# Patient Record
Sex: Male | Born: 1995 | Race: Black or African American | Hispanic: No | Marital: Single | Smoking: Never smoker
Health system: Southern US, Community
[De-identification: ages and names within clinical notes are randomized; demographics above are authoritative.]

## PROBLEM LIST (undated history)

## (undated) HISTORY — PX: WISDOM TOOTH EXTRACTION: SHX21

---
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
## 2014-01-03 ENCOUNTER — Ambulatory Visit (INDEPENDENT_AMBULATORY_CARE_PROVIDER_SITE_OTHER): Payer: BC Managed Care – PPO | Admitting: Family Medicine

## 2014-01-03 VITALS — BP 114/78 | HR 62 | Temp 98.2°F | Resp 16 | Ht 73.5 in | Wt 187.0 lb

## 2014-01-03 DIAGNOSIS — W458XXA Other foreign body or object entering through skin, initial encounter: Secondary | ICD-10-CM

## 2014-01-03 DIAGNOSIS — T148XXA Other injury of unspecified body region, initial encounter: Secondary | ICD-10-CM

## 2014-01-03 MED ORDER — CEPHALEXIN 500 MG PO CAPS: 500.0000 mg | ORAL_CAPSULE | Freq: Three times a day (TID) | ORAL | Status: DC

## 2014-01-03 NOTE — Progress Notes (Signed)
18 year old student going off to [employer] in a couple days. He stepped on a splinter. The right great toe was infected and mother attempted to remove the entire splinter. Patient continued to have some discomfort on the fibular aspect of the great toe.  Objective: Patient has minimal tenderness with a puncture site on the fibular side of the left great toe. After sterile prep with isopropyl alcohol, the area was treated in no definite splinter was seen. Instead there was a yellow discoloration and general area.  Assessment: Residue from splinter  Splinter in skin - Plan: cephALEXin (KEFLEX) 500 MG capsule  Signed, Elvina SidleKurt Lurine Imel, MD

## 2014-05-05 ENCOUNTER — Encounter: Payer: Self-pay | Admitting: Sports Medicine

## 2014-05-05 ENCOUNTER — Ambulatory Visit (INDEPENDENT_AMBULATORY_CARE_PROVIDER_SITE_OTHER): Payer: BC Managed Care – PPO | Admitting: Sports Medicine

## 2014-05-05 VITALS — BP 114/75 | Ht 75.0 in | Wt 183.0 lb

## 2014-05-05 DIAGNOSIS — M79671 Pain in right foot: Secondary | ICD-10-CM | POA: Insufficient documentation

## 2014-05-05 DIAGNOSIS — Q742 Other congenital malformations of lower limb(s), including pelvic girdle: Principal | ICD-10-CM

## 2014-05-05 DIAGNOSIS — Q6689 Other  specified congenital deformities of feet: Secondary | ICD-10-CM | POA: Diagnosis not present

## 2014-05-05 NOTE — Progress Notes (Signed)
  Frank Patrick - 18 y.o. male MRN [id number]  Date of birth: 21-Feb-1996  SUBJECTIVE:  Including CC & ROS.  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 He presents the office today for evaluation of medial right foot pain. Patient reports that on Monday night 05/02/14 he started experiencing soreness in his right foot medial arch. He played throughout the game ignoring the pain is much possible. Following the game he had significant soreness which he iced. The next day he was having difficulty walking because of pain but was weightbearing. Over the last 2 days he reports that the pain has improved although it is still present. He continues to ice intermittently but is not taking any medications.   ROS: Review of systems otherwise negative except for information present in HPI  HISTORY: Past Medical, Surgical, Social, and Family History Reviewed & Updated per EMR. Pertinent Historical Findings include: Nonsmoker Nondiabetic  DATA REVIEWED: No other data available  PHYSICAL EXAM:  VS: BP:114/75 mmHg  HR: bpm  TEMP: ( )  RESP:   HT:6\' 3"  (190.5 cm)   WT:183 lb (83.008 kg)  BMI:22.9 FOOT EXAM:  General: well nourished Skin of LE: warm; dry, no rashes, lesions, ecchymosis or erythema. Vascular: Dorsal pedal pulses 2+ bilaterally Neurologically: Sensation to light touch lower extremities equal and intact bilaterally.  Observation - no ecchymosis, no edema, or hematoma present  Palpation: TTP over the medial navicular bone particularly where the spring ligament attaches. No anterior navicular bone tenderness.Normal ankle motion and strength bilaterally  Extension/flexion 5/5 strength bilaterally in toes Weight-bearing foot exam:  First ray: Neutral  Second ray: Slightly longer than the first toe Lateral ray: Collapse Longitudinal arch: Significant medial arch collapse with standing  Heel position: Valgus contributing to arch collapse Gait analysis:    Striking location: Mid foot Foot motion: Pronation Knee motion: Neutral Hip motion: Neutral  MSK US: Pressure skeletal ultrasound examination of the navicular bone reveals no signs of hypoechoic changes on the medial and anterior aspect unable to fully evaluate the inferior and posterior aspect  ASSESSMENT & PLAN: See problem based charting & AVS for pt instructions.  We spent greater than 50% of our 30 minute office visit in counseling education regarding the patient current clinical problem, risks and benefits of treatment options, and recommend plans for treatment or further evaluation

## 2014-05-05 NOTE — Assessment & Plan Note (Signed)
Patient presented with acute onset of navicular bone pain of the right foot which has gradually improves throughout the week. Based on his clinical history and examination patient has prominent collapsing of medial longitudinal arch as well as valgus stress at the heel, and pronation of gait all these factors can contribute to a differential of spraining of the spring ligament, navicular bone bruising, or potentially navicular stress fracture.   Recommendations: Since the patient is clinically improving we will start with adjustments into his insoles with arch support. Encouraged continued icing and no aggressive running, cutting, pivoting the next 5 days Will follow-up with the patient on Monday by phone see if he continues to clinically improve. If not we'll have the patient be seen by Dr. Thurston HoleWainer on Tuesday to obtain x-rays and possibly an MRI to rule out navicular stress fracture

## 2014-05-26 ENCOUNTER — Encounter: Payer: Self-pay | Admitting: Sports Medicine

## 2014-05-26 ENCOUNTER — Ambulatory Visit (INDEPENDENT_AMBULATORY_CARE_PROVIDER_SITE_OTHER): Payer: BLUE CROSS/BLUE SHIELD | Admitting: Sports Medicine

## 2014-05-26 VITALS — BP 123/74 | Ht 75.0 in | Wt 183.0 lb

## 2014-05-26 DIAGNOSIS — R269 Unspecified abnormalities of gait and mobility: Secondary | ICD-10-CM | POA: Diagnosis not present

## 2014-05-26 DIAGNOSIS — Q742 Other congenital malformations of lower limb(s), including pelvic girdle: Principal | ICD-10-CM

## 2014-05-26 DIAGNOSIS — M79671 Pain in right foot: Secondary | ICD-10-CM

## 2014-05-26 DIAGNOSIS — Q6689 Other  specified congenital deformities of feet: Secondary | ICD-10-CM | POA: Diagnosis not present

## 2014-05-26 NOTE — Progress Notes (Signed)
  Frank Patrick - 19 y.o. male MRN [id number]  Date of birth: April 27, 1996  SUBJECTIVE:  Including CC & ROS.  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 Patient was seen back on 05/05/14 for pain in the medial arch and over the navicular is treated with 1 week of rest and scaphoid pad with insoles. After this time. In returning to basketball he developed recurrence of his pain. He was subsequently evaluated with an MRI of the foot the revealed no evidence of stress fracture within the navicular bone, mild increased signal in the cuboid, and some swelling in the sinus tarsi. This week patient reports the most of his pain has improved but not completely resolved. He feels that the scaphoid pad that was added to his insoles did provide him some relief. He also completed 10 days of meloxicam.   ROS: Review of systems otherwise negative except for information present in HPI  HISTORY: Past Medical, Surgical, Social, and Family History Reviewed & Updated per EMR. Pertinent Historical Findings include: Nonsmoker Nondiabetic  DATA REVIEWED: Personally reviewed patient's right foot MRI revealing mild increased signaling in the cuboid no evidence of increasingly in the navicular, mild edema in the sinus tarsi  PHYSICAL EXAM:  VS: BP:123/74 mmHg  HR: bpm  TEMP: ( )  RESP:   HT:6\' 3"  (190.5 cm)   WT:183 lb (83.008 kg)  BMI:22.9 FOOT EXAM:  General: well nourished Skin of LE: warm; dry, no rashes, lesions, ecchymosis or erythema. Vascular: Dorsal pedal pulses 2+ bilaterally Neurologically: Sensation to light touch lower extremities equal and intact bilaterally.  Observation - no ecchymosis, no edema, or hematoma present  Palpation: TTP over the medial navicular bone particularly where the spring ligament attaches. No anterior navicular bone tenderness.Normal ankle motion and  strength bilaterally  Extension/flexion 5/5 strength bilaterally in toes Weight-bearing foot exam:  First ray: Neutral Second ray: Slightly longer than the first toe Lateral ray: Collapse Longitudinal arch: Significant medial arch collapse with standing - pes plantus Heel position: Valgus contributing to arch collapse Gait analysis:  Striking location: Mid foot Foot motion: significant pronation Knee motion: Neutral Hip motion: Neutral  ASSESSMENT & PLAN: See problem based charting & AVS for pt instructions. Patient presented today for follow-up right midfoot navicular pain which was likely an acute resolving bone limitation. He also has evidence of gait abnormalities with significant pronation and valgus heels. Presents today for [employer] orthotics  Orthotic Fitting and Adjustment note: Patient was fitted for a : standard, cushioned, semi-rigid orthotic.  The orthotic was heated and afterward the patient stood on the orthotic blank positioned on the orthotic stand.  The patient was positioned in subtalar neutral position and 10 degrees of ankle dorsiflexion in a weight bearing stance.  After completion of molding, a stable base was applied to the orthotic blank.  The blank was ground to a stable position for weight bearing.  Size: 12 Base: Blue EVA Posting: none Additional orthotic padding: none  Greater than 50% of the patient's visit for a total of 30 minutes, was spent conducting face-to-face counseling for bilateral foot orthotics fitting and constructing

## 2014-07-19 IMAGING — CR DG ANKLE COMPLETE 3+V*R*
1 series · 1 of 1 positions shown · non-contrast
Comparison: Right foot obtained at the same time.

CLINICAL DATA: Right ankle pain.  No reported injury.

RIGHT ANKLE - COMPLETE 3+ VIEW

[AP]
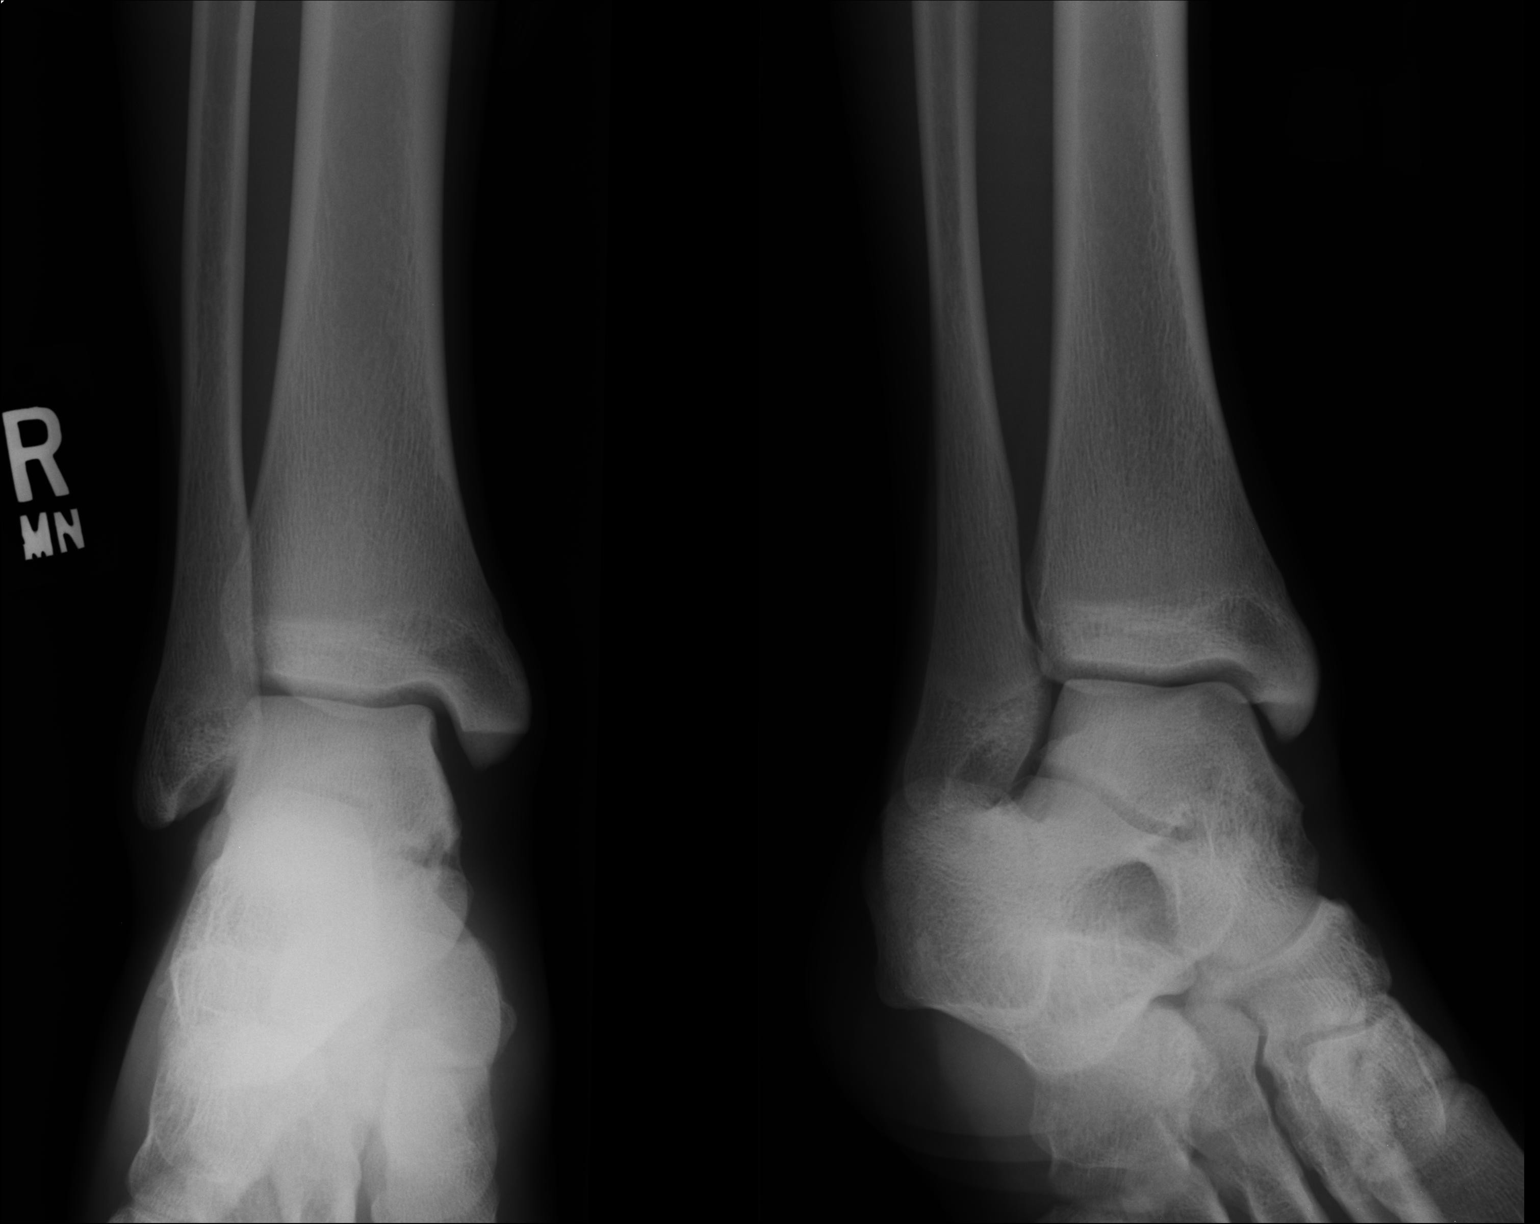

[1 of 1 positions shown; findings below may reference images not displayed]

FINDINGS: AP and oblique views of the right ankle are submitted for
interpretation.  The lateral view of the foot includes the ankle.
Normal appearing bones and soft tissues.
IMPRESSION: Normal examination.

## 2018-11-30 DIAGNOSIS — Z23 Encounter for immunization: Secondary | ICD-10-CM | POA: Diagnosis not present

## 2018-12-18 DIAGNOSIS — Z0000 Encounter for general adult medical examination without abnormal findings: Secondary | ICD-10-CM | POA: Diagnosis not present

## 2018-12-18 DIAGNOSIS — Z13220 Encounter for screening for lipoid disorders: Secondary | ICD-10-CM | POA: Diagnosis not present

## 2019-01-04 DIAGNOSIS — R17 Unspecified jaundice: Secondary | ICD-10-CM | POA: Diagnosis not present

## 2019-11-01 DIAGNOSIS — S93402A Sprain of unspecified ligament of left ankle, initial encounter: Secondary | ICD-10-CM | POA: Diagnosis not present

## 2019-11-01 DIAGNOSIS — M25572 Pain in left ankle and joints of left foot: Secondary | ICD-10-CM | POA: Diagnosis not present

## 2019-11-23 DIAGNOSIS — S93402A Sprain of unspecified ligament of left ankle, initial encounter: Secondary | ICD-10-CM | POA: Diagnosis not present

## 2019-12-20 DIAGNOSIS — Z0000 Encounter for general adult medical examination without abnormal findings: Secondary | ICD-10-CM | POA: Diagnosis not present

## 2019-12-20 DIAGNOSIS — Z13220 Encounter for screening for lipoid disorders: Secondary | ICD-10-CM | POA: Diagnosis not present

## 2020-03-03 DIAGNOSIS — M7652 Patellar tendinitis, left knee: Secondary | ICD-10-CM | POA: Diagnosis not present

## 2020-03-03 DIAGNOSIS — M25562 Pain in left knee: Secondary | ICD-10-CM | POA: Diagnosis not present

## 2020-03-03 DIAGNOSIS — E65 Localized adiposity: Secondary | ICD-10-CM | POA: Diagnosis not present

## 2020-03-14 DIAGNOSIS — M25562 Pain in left knee: Secondary | ICD-10-CM | POA: Diagnosis not present

## 2020-03-30 DIAGNOSIS — M25562 Pain in left knee: Secondary | ICD-10-CM | POA: Diagnosis not present

## 2020-07-04 DIAGNOSIS — H35412 Lattice degeneration of retina, left eye: Secondary | ICD-10-CM | POA: Diagnosis not present

## 2020-07-04 DIAGNOSIS — H40013 Open angle with borderline findings, low risk, bilateral: Secondary | ICD-10-CM | POA: Diagnosis not present

## 2020-09-09 DIAGNOSIS — Z8616 Personal history of COVID-19: Secondary | ICD-10-CM

## 2020-09-09 HISTORY — DX: Personal history of COVID-19: Z86.16

## 2020-09-19 DIAGNOSIS — M25571 Pain in right ankle and joints of right foot: Secondary | ICD-10-CM | POA: Diagnosis not present

## 2020-09-19 DIAGNOSIS — S86091A Other specified injury of right Achilles tendon, initial encounter: Secondary | ICD-10-CM | POA: Diagnosis not present

## 2020-09-22 ENCOUNTER — Encounter (HOSPITAL_BASED_OUTPATIENT_CLINIC_OR_DEPARTMENT_OTHER): Payer: Self-pay | Admitting: Orthopedic Surgery

## 2020-09-22 ENCOUNTER — Other Ambulatory Visit: Payer: Self-pay

## 2020-09-22 ENCOUNTER — Other Ambulatory Visit (HOSPITAL_COMMUNITY): Payer: Self-pay | Admitting: Orthopedic Surgery

## 2020-09-22 DIAGNOSIS — S86091A Other specified injury of right Achilles tendon, initial encounter: Secondary | ICD-10-CM | POA: Diagnosis not present

## 2020-09-25 ENCOUNTER — Other Ambulatory Visit (HOSPITAL_COMMUNITY)
Admission: RE | Admit: 2020-09-25 | Discharge: 2020-09-25 | Disposition: A | Discharge status: Home / Self Care | Payer: BLUE CROSS/BLUE SHIELD | Source: Ambulatory Visit | Attending: Orthopedic Surgery | Admitting: Orthopedic Surgery

## 2020-09-25 DIAGNOSIS — Z01812 Encounter for preprocedural laboratory examination: Secondary | ICD-10-CM | POA: Diagnosis not present

## 2020-09-25 DIAGNOSIS — U071 COVID-19: Secondary | ICD-10-CM | POA: Diagnosis not present

## 2020-09-25 LAB — SARS CORONAVIRUS 2 (TAT 6-24 HRS): SARS Coronavirus 2: POSITIVE — AB

## 2020-09-26 ENCOUNTER — Telehealth (HOSPITAL_COMMUNITY): Payer: Self-pay

## 2020-09-26 NOTE — Telephone Encounter (Signed)
09/26/20@10 :53am - contacted surgeon's office - spoke to [facility] - advised of patient's Positive Covid19 lab results. Also, advised I left a detailed message on surgeon cell phone. MBM

## 2020-09-27 ENCOUNTER — Telehealth: Payer: Self-pay | Admitting: Adult Health

## 2020-09-27 NOTE — Telephone Encounter (Signed)
Called to discuss with patient about COVID-19 symptoms and the use of one of the available treatments for those with mild to moderate Covid symptoms and at a high risk of hospitalization.  Pt appears to qualify for outpatient treatment due to co-morbid conditions and/or a member of an at-risk group in accordance with the FDA Emergency Use Authorization.      Unable to reach pt - LMOM   Frank Patrick C Shiana Rappleye   

## 2020-09-28 ENCOUNTER — Encounter (HOSPITAL_BASED_OUTPATIENT_CLINIC_OR_DEPARTMENT_OTHER): Payer: Self-pay | Admitting: Orthopedic Surgery

## 2020-10-05 ENCOUNTER — Encounter (HOSPITAL_BASED_OUTPATIENT_CLINIC_OR_DEPARTMENT_OTHER)
Admission: RE | Disposition: A | Discharge status: Home / Self Care | Payer: Self-pay | Source: Home / Self Care | Attending: Orthopedic Surgery

## 2020-10-05 ENCOUNTER — Other Ambulatory Visit: Payer: Self-pay

## 2020-10-05 ENCOUNTER — Ambulatory Visit (HOSPITAL_BASED_OUTPATIENT_CLINIC_OR_DEPARTMENT_OTHER): Payer: BLUE CROSS/BLUE SHIELD | Admitting: Certified Registered"

## 2020-10-05 ENCOUNTER — Ambulatory Visit (HOSPITAL_BASED_OUTPATIENT_CLINIC_OR_DEPARTMENT_OTHER)
Admission: RE | Admit: 2020-10-05 | Discharge: 2020-10-05 | Disposition: A | Discharge status: Home / Self Care | Payer: BLUE CROSS/BLUE SHIELD | Attending: Orthopedic Surgery | Admitting: Orthopedic Surgery

## 2020-10-05 ENCOUNTER — Encounter (HOSPITAL_BASED_OUTPATIENT_CLINIC_OR_DEPARTMENT_OTHER): Payer: Self-pay | Admitting: Orthopedic Surgery

## 2020-10-05 DIAGNOSIS — R2689 Other abnormalities of gait and mobility: Secondary | ICD-10-CM | POA: Diagnosis not present

## 2020-10-05 DIAGNOSIS — X509XXA Other and unspecified overexertion or strenuous movements or postures, initial encounter: Secondary | ICD-10-CM | POA: Diagnosis not present

## 2020-10-05 DIAGNOSIS — S86011A Strain of right Achilles tendon, initial encounter: Secondary | ICD-10-CM | POA: Diagnosis not present

## 2020-10-05 DIAGNOSIS — S86091A Other specified injury of right Achilles tendon, initial encounter: Secondary | ICD-10-CM | POA: Diagnosis not present

## 2020-10-05 DIAGNOSIS — Z8616 Personal history of COVID-19: Secondary | ICD-10-CM | POA: Diagnosis not present

## 2020-10-05 DIAGNOSIS — G8918 Other acute postprocedural pain: Secondary | ICD-10-CM | POA: Diagnosis not present

## 2020-10-05 DIAGNOSIS — Y9367 Activity, basketball: Secondary | ICD-10-CM | POA: Insufficient documentation

## 2020-10-05 HISTORY — PX: ACHILLES TENDON SURGERY: SHX542

## 2020-10-05 SURGERY — REPAIR, TENDON, ACHILLES
Anesthesia: General | Site: Foot | Laterality: Right

## 2020-10-05 MED ORDER — DEXMEDETOMIDINE (PRECEDEX) IN NS 20 MCG/5ML (4 MCG/ML) IV SYRINGE
PREFILLED_SYRINGE | INTRAVENOUS | Status: DC | PRN
Start: 2020-10-05 — End: 2020-10-05
  Administered 2020-10-05: 8 ug via INTRAVENOUS

## 2020-10-05 MED ORDER — LIDOCAINE 2% (20 MG/ML) 5 ML SYRINGE
INTRAMUSCULAR | Status: AC
  Filled 2020-10-05: qty 15

## 2020-10-05 MED ORDER — PROPOFOL 10 MG/ML IV BOLUS
INTRAVENOUS | Status: DC | PRN
  Administered 2020-10-05: 200 mg via INTRAVENOUS

## 2020-10-05 MED ORDER — AMISULPRIDE (ANTIEMETIC) 5 MG/2ML IV SOLN: 10.0000 mg | Freq: Once | INTRAVENOUS | Status: DC | PRN

## 2020-10-05 MED ORDER — EPHEDRINE 5 MG/ML INJ
INTRAVENOUS | Status: AC
  Filled 2020-10-05: qty 10

## 2020-10-05 MED ORDER — OXYCODONE HCL 5 MG/5ML PO SOLN
5.0000 mg | Freq: Once | ORAL | Status: DC | PRN
Start: 2020-10-05 — End: 2020-10-05

## 2020-10-05 MED ORDER — PHENYLEPHRINE 40 MCG/ML (10ML) SYRINGE FOR IV PUSH (FOR BLOOD PRESSURE SUPPORT)
PREFILLED_SYRINGE | INTRAVENOUS | Status: AC
  Filled 2020-10-05: qty 20

## 2020-10-05 MED ORDER — ROPIVACAINE HCL 5 MG/ML IJ SOLN
INTRAMUSCULAR | Status: DC | PRN
  Administered 2020-10-05: 30 mL via PERINEURAL

## 2020-10-05 MED ORDER — DEXAMETHASONE SODIUM PHOSPHATE 10 MG/ML IJ SOLN
INTRAMUSCULAR | Status: AC
  Filled 2020-10-05: qty 2

## 2020-10-05 MED ORDER — MIDAZOLAM HCL 2 MG/2ML IJ SOLN
INTRAMUSCULAR | Status: AC
  Filled 2020-10-05: qty 2

## 2020-10-05 MED ORDER — FENTANYL CITRATE (PF) 100 MCG/2ML IJ SOLN
INTRAMUSCULAR | Status: AC
  Filled 2020-10-05: qty 2

## 2020-10-05 MED ORDER — HYDROMORPHONE HCL 1 MG/ML IJ SOLN: 0.2500 mg | INTRAMUSCULAR | Status: DC | PRN

## 2020-10-05 MED ORDER — LIDOCAINE HCL (CARDIAC) PF 100 MG/5ML IV SOSY
PREFILLED_SYRINGE | INTRAVENOUS | Status: DC | PRN
  Administered 2020-10-05: 30 mg via INTRAVENOUS

## 2020-10-05 MED ORDER — DEXAMETHASONE SODIUM PHOSPHATE 4 MG/ML IJ SOLN
INTRAMUSCULAR | Status: DC | PRN
  Administered 2020-10-05: 4 mg via INTRAVENOUS

## 2020-10-05 MED ORDER — SODIUM CHLORIDE 0.9 % IV SOLN: INTRAVENOUS | Status: DC

## 2020-10-05 MED ORDER — MIDAZOLAM HCL 2 MG/2ML IJ SOLN
2.0000 mg | Freq: Once | INTRAMUSCULAR | Status: AC
  Administered 2020-10-05: 2 mg via INTRAVENOUS

## 2020-10-05 MED ORDER — OXYCODONE HCL 5 MG PO TABS: 5.0000 mg | ORAL_TABLET | Freq: Once | ORAL | Status: DC | PRN

## 2020-10-05 MED ORDER — FENTANYL CITRATE (PF) 100 MCG/2ML IJ SOLN
INTRAMUSCULAR | Status: DC | PRN
  Administered 2020-10-05: 50 ug via INTRAVENOUS

## 2020-10-05 MED ORDER — PROPOFOL 500 MG/50ML IV EMUL
INTRAVENOUS | Status: AC
  Filled 2020-10-05: qty 150

## 2020-10-05 MED ORDER — LACTATED RINGERS IV SOLN: INTRAVENOUS | Status: DC

## 2020-10-05 MED ORDER — 0.9 % SODIUM CHLORIDE (POUR BTL) OPTIME
TOPICAL | Status: DC | PRN
  Administered 2020-10-05: 200 mL

## 2020-10-05 MED ORDER — FENTANYL CITRATE (PF) 100 MCG/2ML IJ SOLN
100.0000 ug | Freq: Once | INTRAMUSCULAR | Status: AC
  Administered 2020-10-05: 100 ug via INTRAVENOUS

## 2020-10-05 MED ORDER — SUGAMMADEX SODIUM 500 MG/5ML IV SOLN
INTRAVENOUS | Status: AC
  Filled 2020-10-05: qty 5

## 2020-10-05 MED ORDER — ROCURONIUM BROMIDE 100 MG/10ML IV SOLN
INTRAVENOUS | Status: DC | PRN
  Administered 2020-10-05: 80 mg via INTRAVENOUS

## 2020-10-05 MED ORDER — VANCOMYCIN HCL 500 MG IV SOLR
INTRAVENOUS | Status: DC | PRN
  Administered 2020-10-05: 500 mg via TOPICAL

## 2020-10-05 MED ORDER — OXYCODONE HCL 5 MG PO TABS: 5.0000 mg | ORAL_TABLET | Freq: Four times a day (QID) | ORAL | 0 refills | Status: AC | PRN

## 2020-10-05 MED ORDER — CEFAZOLIN SODIUM-DEXTROSE 2-4 GM/100ML-% IV SOLN
INTRAVENOUS | Status: AC
  Filled 2020-10-05: qty 100

## 2020-10-05 MED ORDER — CEFAZOLIN SODIUM-DEXTROSE 2-4 GM/100ML-% IV SOLN
2.0000 g | INTRAVENOUS | Status: AC
  Administered 2020-10-05: 2 g via INTRAVENOUS

## 2020-10-05 MED ORDER — VANCOMYCIN HCL 500 MG IV SOLR
INTRAVENOUS | Status: AC
  Filled 2020-10-05: qty 500

## 2020-10-05 MED ORDER — ONDANSETRON HCL 4 MG/2ML IJ SOLN
INTRAMUSCULAR | Status: AC
  Filled 2020-10-05: qty 12

## 2020-10-05 MED ORDER — PROMETHAZINE HCL 25 MG/ML IJ SOLN: 6.2500 mg | INTRAMUSCULAR | Status: DC | PRN

## 2020-10-05 SURGICAL SUPPLY — 73 items
APL PRP STRL LF DISP 70% ISPRP (MISCELLANEOUS) ×1
BANDAGE ESMARK 6X9 LF (GAUZE/BANDAGES/DRESSINGS) ×1 IMPLANT
BLADE AVERAGE 25X9 (BLADE) IMPLANT
BLADE MICRO SAGITTAL (BLADE) IMPLANT
BLADE SURG 15 STRL LF DISP TIS (BLADE) ×2 IMPLANT
BLADE SURG 15 STRL SS (BLADE) ×4
BNDG CMPR 9X6 STRL LF SNTH (GAUZE/BANDAGES/DRESSINGS) ×2
BNDG COHESIVE 4X5 TAN STRL (GAUZE/BANDAGES/DRESSINGS) ×2 IMPLANT
BNDG COHESIVE 6X5 TAN STRL LF (GAUZE/BANDAGES/DRESSINGS) ×2 IMPLANT
BNDG ESMARK 6X9 LF (GAUZE/BANDAGES/DRESSINGS) ×4
CANISTER SUCT 1200ML W/VALVE (MISCELLANEOUS) ×2 IMPLANT
CHLORAPREP W/TINT 26 (MISCELLANEOUS) ×2 IMPLANT
COVER BACK TABLE 60X90IN (DRAPES) ×2 IMPLANT
COVER WAND RF STERILE (DRAPES) IMPLANT
CUFF TOURN SGL QUICK 34 (TOURNIQUET CUFF) ×2
CUFF TRNQT CYL 34X4.125X (TOURNIQUET CUFF) ×1 IMPLANT
DRAPE EXTREMITY T 121X128X90 (DISPOSABLE) ×2 IMPLANT
DRAPE OEC MINIVIEW 54X84 (DRAPES) IMPLANT
DRAPE U-SHAPE 47X51 STRL (DRAPES) ×2 IMPLANT
DRSG MEPITEL 4X7.2 (GAUZE/BANDAGES/DRESSINGS) ×2 IMPLANT
DRSG PAD ABDOMINAL 8X10 ST (GAUZE/BANDAGES/DRESSINGS) ×4 IMPLANT
ELECT REM PT RETURN 9FT ADLT (ELECTROSURGICAL) ×2
ELECTRODE REM PT RTRN 9FT ADLT (ELECTROSURGICAL) ×1 IMPLANT
GAUZE SPONGE 4X4 12PLY STRL (GAUZE/BANDAGES/DRESSINGS) ×2 IMPLANT
GLOVE SRG 8 PF TXTR STRL LF DI (GLOVE) ×2 IMPLANT
GLOVE SURG ENC MOIS LTX SZ8 (GLOVE) ×3 IMPLANT
GLOVE SURG LTX SZ8 (GLOVE) ×1 IMPLANT
GLOVE SURG POLYISO LF SZ7 (GLOVE) ×1 IMPLANT
GLOVE SURG POLYISO LF SZ7.5 (GLOVE) ×1 IMPLANT
GLOVE SURG UNDER POLY LF SZ7 (GLOVE) ×2 IMPLANT
GLOVE SURG UNDER POLY LF SZ7.5 (GLOVE) ×1 IMPLANT
GLOVE SURG UNDER POLY LF SZ8 (GLOVE) ×4
GOWN STRL REUS W/ TWL LRG LVL3 (GOWN DISPOSABLE) ×1 IMPLANT
GOWN STRL REUS W/ TWL XL LVL3 (GOWN DISPOSABLE) ×2 IMPLANT
GOWN STRL REUS W/TWL LRG LVL3 (GOWN DISPOSABLE) ×2
GOWN STRL REUS W/TWL XL LVL3 (GOWN DISPOSABLE) ×4
NDL HYPO 25X1 1.5 SAFETY (NEEDLE) IMPLANT
NDL SUT 6 .5 CRC .975X.05 MAYO (NEEDLE) IMPLANT
NEEDLE HYPO 25X1 1.5 SAFETY (NEEDLE) IMPLANT
NEEDLE MAYO TAPER (NEEDLE)
PACK BASIN DAY SURGERY FS (CUSTOM PROCEDURE TRAY) ×2 IMPLANT
PAD CAST 4YDX4 CTTN HI CHSV (CAST SUPPLIES) ×1 IMPLANT
PADDING CAST ABS 4INX4YD NS (CAST SUPPLIES)
PADDING CAST ABS COTTON 4X4 ST (CAST SUPPLIES) IMPLANT
PADDING CAST COTTON 4X4 STRL (CAST SUPPLIES) ×2
PADDING CAST COTTON 6X4 STRL (CAST SUPPLIES) ×2 IMPLANT
PENCIL SMOKE EVACUATOR (MISCELLANEOUS) ×2 IMPLANT
SANITIZER HAND PURELL 535ML FO (MISCELLANEOUS) ×2 IMPLANT
SHEET MEDIUM DRAPE 40X70 STRL (DRAPES) ×2 IMPLANT
SLEEVE SCD COMPRESS KNEE MED (STOCKING) ×2 IMPLANT
SPLINT FAST PLASTER 5X30 (CAST SUPPLIES) ×20
SPLINT PLASTER CAST FAST 5X30 (CAST SUPPLIES) ×20 IMPLANT
SPONGE LAP 18X18 RF (DISPOSABLE) ×2 IMPLANT
STOCKINETTE 6  STRL (DRAPES) ×2
STOCKINETTE 6 STRL (DRAPES) ×1 IMPLANT
SUCTION FRAZIER HANDLE 10FR (MISCELLANEOUS) ×2
SUCTION TUBE FRAZIER 10FR DISP (MISCELLANEOUS) IMPLANT
SUT ETHILON 3 0 PS 1 (SUTURE) ×3 IMPLANT
SUT FIBERWIRE #2 38 T-5 BLUE (SUTURE)
SUT MNCRL AB 3-0 PS2 18 (SUTURE) ×2 IMPLANT
SUT VIC AB 0 SH 27 (SUTURE) ×1 IMPLANT
SUT VIC AB 1 CT1 27 (SUTURE) ×6
SUT VIC AB 1 CT1 27XBRD ANBCTR (SUTURE) IMPLANT
SUT VIC AB 2-0 SH 27 (SUTURE) ×2
SUT VIC AB 2-0 SH 27XBRD (SUTURE) IMPLANT
SUTURE FIBERWR #2 38 T-5 BLUE (SUTURE) IMPLANT
SUTURE TAPE 1.3 FIBERLOP 20 ST (SUTURE) IMPLANT
SUTURETAPE 1.3 FIBERLOOP 20 ST (SUTURE)
SYR BULB EAR ULCER 3OZ GRN STR (SYRINGE) ×2 IMPLANT
SYR CONTROL 10ML LL (SYRINGE) IMPLANT
TOWEL GREEN STERILE FF (TOWEL DISPOSABLE) ×4 IMPLANT
TUBE CONNECTING 20X1/4 (TUBING) ×2 IMPLANT
UNDERPAD 30X36 HEAVY ABSORB (UNDERPADS AND DIAPERS) ×2 IMPLANT

## 2020-10-05 NOTE — Anesthesia Preprocedure Evaluation (Signed)
Anesthesia Evaluation  Patient identified by MRN, date of birth, ID band Patient awake    Reviewed: Allergy & Precautions, NPO status , Patient's Chart, lab work & pertinent test results  Airway Mallampati: II  TM Distance: >3 FB Neck ROM: Full    Dental no notable dental hx.    Pulmonary neg pulmonary ROS,    Pulmonary exam normal breath sounds clear to auscultation       Cardiovascular negative cardio ROS Normal cardiovascular exam Rhythm:Regular Rate:Normal     Neuro/Psych negative neurological ROS  negative psych ROS   GI/Hepatic negative GI ROS, Neg liver ROS,   Endo/Other  negative endocrine ROS  Renal/GU negative Renal ROS  negative genitourinary   Musculoskeletal negative musculoskeletal ROS (+)   Abdominal   Peds negative pediatric ROS (+)  Hematology negative hematology ROS (+)   Anesthesia Other Findings   Reproductive/Obstetrics negative OB ROS                             Anesthesia Physical Anesthesia Plan  ASA: I  Anesthesia Plan: General   Post-op Pain Management:  Regional for Post-op pain   Induction: Intravenous  PONV Risk Score and Plan: 2 and Ondansetron, Midazolam and Treatment may vary due to age or medical condition  Airway Management Planned: Oral ETT  Additional Equipment:   Intra-op Plan:   Post-operative Plan: Extubation in OR  Informed Consent: I have reviewed the patients History and Physical, chart, labs and discussed the procedure including the risks, benefits and alternatives for the proposed anesthesia with the patient or authorized representative who has indicated his/her understanding and acceptance.     Dental advisory given  Plan Discussed with: CRNA  Anesthesia Plan Comments:         Anesthesia Quick Evaluation

## 2020-10-05 NOTE — Discharge Instructions (Addendum)
[name], MD [facility]  Please read the following information regarding your care after surgery.  Medications  You only need a prescription for the narcotic pain medicine (ex. oxycodone, Percocet, Norco).  All of the other medicines listed below are available over the counter. X Aleve 2 pills twice a day for the first 3 days after surgery. X acetominophen (Tylenol) 500 mg every 6-8 hours as you need for minor to moderate pain X oxycodone as prescribed for severe pain  Narcotic pain medicine (ex. oxycodone, Percocet, Vicodin) will cause constipation.  To prevent this problem, take the following medicines while you are taking any pain medicine. X docusate sodium (Colace) 100 mg twice a day X senna (Senokot) 2 tablets twice a day  X To help prevent blood clots, take a baby aspirin (81 mg) twice a day for two weeks after surgery.  You should also get up every hour while you are awake to move around.    Weight Bearing ? Bear weight when you are able on your operated leg or foot. ? Bear weight only on your operated foot in the post-op Frank Patrick. X Do not bear any weight on the operated leg or foot.  Cast / Splint / Dressing X Keep your splint, cast or dressing clean and dry.  Don't put anything (coat hanger, pencil, etc) down inside of it.  If it gets damp, use a hair dryer on the cool setting to dry it.  If it gets soaked, call the office to schedule an appointment for a cast change. ? Remove your dressing 3 days after surgery and cover the incisions with dry dressings.    After your dressing, cast or splint is removed; you may shower, but do not soak or scrub the wound.  Allow the water to run over it, and then gently pat it dry.  Swelling It is normal for you to have swelling where you had surgery.  To reduce swelling and pain, keep your toes above your nose for at least 3 days after surgery.  It may be necessary to keep your foot or leg elevated for several weeks.  If it hurts, it should be  elevated.  Follow Up Call my office at [phone] when you are discharged from the hospital or surgery center to schedule an appointment to be seen two weeks after surgery.  Call my office at [phone] if you develop a fever >101.5 F, nausea, vomiting, bleeding from the surgical site or severe pain.     Post Anesthesia Home Care Instructions  Activity: Get plenty of rest for the remainder of the day. A responsible individual must stay with you for 24 hours following the procedure.  For the next 24 hours, DO NOT: -Drive a car -Advertising copywriter -Drink alcoholic beverages -Take any medication unless instructed by your physician -Make any legal decisions or sign important papers.  Meals: Start with liquid foods such as gelatin or soup. Progress to regular foods as tolerated. Avoid greasy, spicy, heavy foods. If nausea and/or vomiting occur, drink only clear liquids until the nausea and/or vomiting subsides. Call your physician if vomiting continues.  Special Instructions/Symptoms: Your throat may feel dry or sore from the anesthesia or the breathing tube placed in your throat during surgery. If this causes discomfort, gargle with warm salt water. The discomfort should disappear within 24 hours.  If you had a scopolamine patch placed behind your ear for the management of post- operative nausea and/or vomiting:  1. The medication in the patch is  effective for 72 hours, after which it should be removed.  Wrap patch in a tissue and discard in the trash. Wash hands thoroughly with soap and water. 2. You may remove the patch earlier than 72 hours if you experience unpleasant side effects which may include dry mouth, dizziness or visual disturbances. 3. Avoid touching the patch. Wash your hands with soap and water after contact with the patch.    Regional Anesthesia Blocks  1. Numbness or the inability to move the "blocked" extremity may last from 3-48 hours after placement. The length  of time depends on the medication injected and your individual response to the medication. If the numbness is not going away after 48 hours, call your surgeon.  2. The extremity that is blocked will need to be protected until the numbness is gone and the  Strength has returned. Because you cannot feel it, you will need to take extra care to avoid injury. Because it may be weak, you may have difficulty moving it or using it. You may not know what position it is in without looking at it while the block is in effect.  3. For blocks in the legs and feet, returning to weight bearing and walking needs to be done carefully. You will need to wait until the numbness is entirely gone and the strength has returned. You should be able to move your leg and foot normally before you try and bear weight or walk. You will need someone to be with you when you first try to ensure you do not fall and possibly risk injury.  4. Bruising and tenderness at the needle site are common side effects and will resolve in a few days.  5. Persistent numbness or new problems with movement should be communicated to the surgeon or the [facility] [phone] [facility] [phone]).

## 2020-10-05 NOTE — Progress Notes (Signed)
Assisted Dr. Miller with right, ultrasound guided, popliteal block. Side rails up, monitors on throughout procedure. See vital signs in flow sheet. Tolerated Procedure well. 

## 2020-10-05 NOTE — Anesthesia Procedure Notes (Signed)
Procedure Name: Intubation Date/Time: 10/05/2020 7:40 AM Performed by: Signe Colt, CRNA Pre-anesthesia Checklist: Patient identified, Emergency Drugs available, Suction available and Patient being monitored Patient Re-evaluated:Patient Re-evaluated prior to induction Oxygen Delivery Method: Circle system utilized Preoxygenation: Pre-oxygenation with 100% oxygen Induction Type: IV induction Ventilation: Mask ventilation without difficulty Laryngoscope Size: Mac and 3 Grade View: Grade I Tube type: Oral Tube size: 7.0 mm Number of attempts: 1 Airway Equipment and Method: Stylet and Oral airway Placement Confirmation: ETT inserted through vocal cords under direct vision,  positive ETCO2 and breath sounds checked- equal and bilateral Secured at: 22 cm Tube secured with: Tape Dental Injury: Teeth and Oropharynx as per pre-operative assessment

## 2020-10-05 NOTE — Op Note (Signed)
10/05/2020  8:47 AM  PATIENT:  Frank Patrick  25 y.o. male  PRE-OPERATIVE DIAGNOSIS:  Right Achilles tendon rupture  POST-OPERATIVE DIAGNOSIS:  Right Achilles tendon rupture  Procedure(s):  Right ACHILLES TENDON REPAIR  SURGEON:  [name], MD  ASSISTANT: none  ANESTHESIA:   General, regional  EBL:  minimal   TOURNIQUET:   Total Tourniquet Time Documented: Thigh (Right) - 7 minutes Thigh (Right) - 30 minutes Total: Thigh (Right) - 37 minutes  COMPLICATIONS:  None apparent  DISPOSITION:  Extubated, awake and stable to recovery.  INDICATION FOR PROCEDURE: The patient is a 25 year old male who ruptured his Achilles tendon playing basketball approximately 2 weeks ago.  He presents now for operative treatment of this acute Achilles tendon rupture.  The risks and benefits of the alternative treatment options have been discussed in detail.  The patient wishes to proceed with surgery and specifically understands risks of bleeding, infection, nerve damage, blood clots, need for additional surgery, amputation and death.  PROCEDURE IN DETAIL: After preoperative consent was obtained and the correct operative site was identified, the patient was brought to the operating room supine on a stretcher.  General anesthesia was induced.  Preoperative antibiotics were administered.  A surgical timeout was taken.  The right lower extremity was exsanguinated and a thigh tourniquet inflated to 250 mmHg.  Patient was then turned in the prone position on the operating table with all bony prominences padded well.  The right lower extremity was prepped and draped in standard sterile fashion.  A palpable defect was identified in the tendon.  The incision was made over this defect.  Dissection was carried sharply down through the skin subcutaneous tissues and peritenon.  Full-thickness flaps were retracted medially and laterally.  The Achilles tendon rupture was identified.  It was cleaned of all hematoma and  irrigated copiously.  The fascia over the deep compartment was incised and released proximally and distally to allow easier closure of the peritenon.  Three #1 Vicryl sutures were placed in box fashion around the Achilles rupture.  These were spaced 1 cm from the rupture and then 1 cm in between each suture.  The ankle was maximally plantarflexed and the sutures tied from inner to outer in sequence.  The [place] test was noted to be normal at this point.  A 3-0 Vicryl running Silfverskiold suture was placed circumferentially around the tendon repair.  Wound was irrigated again copiously and sprinkled with vancomycin powder.  The peritenon was repaired with inverted simple sutures of 2-0 Vicryl.  The skin incision was closed with horizontal mattress sutures of 3-0 nylon.  Sterile dressings were applied followed by a well-padded short leg splint with the ankle in gravity equinus.  The tourniquet was released after application of the dressings.  The patient was awakened from anesthesia and transported to the recovery room in stable condition.   FOLLOW UP PLAN: Nonweightbearing on the right lower extremity.  Follow-up in the office in 2 weeks for suture removal and conversion to a cam boot with 2 heel lifts.  Aspirin for DVT prophylaxis.

## 2020-10-05 NOTE — Transfer of Care (Signed)
Immediate Anesthesia Transfer of Care Note  Patient: Frank Patrick  Procedure(s) Performed: ACHILLES TENDON REPAIR (Right Foot)  Patient Location: PACU  Anesthesia Type:GA combined with regional for post-op pain  Level of Consciousness: drowsy and patient cooperative  Airway & Oxygen Therapy: Patient Spontanous Breathing and Patient connected to face mask oxygen  Post-op Assessment: Report given to RN and Post -op Vital signs reviewed and stable  Post vital signs: Reviewed and stable  Last Vitals:  Vitals Value Taken Time  BP 121/75 10/05/20 0853  Temp    Pulse 85 10/05/20 0854  Resp 19 10/05/20 0854  SpO2 98 % 10/05/20 0854  Vitals shown include unvalidated device data.  Last Pain:  Vitals:   10/05/20 0634  TempSrc: Oral  PainSc: 2          Complications: No complications documented.

## 2020-10-05 NOTE — Anesthesia Postprocedure Evaluation (Signed)
Anesthesia Post Note  Patient: Frank Patrick  Procedure(s) Performed: ACHILLES TENDON REPAIR (Right Foot)     Patient location during evaluation: PACU Anesthesia Type: General Level of consciousness: awake and alert Pain management: pain level controlled Vital Signs Assessment: post-procedure vital signs reviewed and stable Respiratory status: spontaneous breathing, nonlabored ventilation and respiratory function stable Cardiovascular status: blood pressure returned to baseline and stable Postop Assessment: no apparent nausea or vomiting Anesthetic complications: no   No complications documented.  Last Vitals:  Vitals:   10/05/20 0915 10/05/20 0959  BP: 139/76 (!) 156/86  Pulse: (!) 118 98  Resp: 19 16  Temp:  36.8 C  SpO2: 98% 97%    Last Pain:  Vitals:   10/05/20 0937  TempSrc:   PainSc: 0-No pain                 Lowella Curb

## 2020-10-05 NOTE — H&P (Signed)
Frank Patrick is an 25 y.o. male.   Chief Complaint: right ankle injury HPI: The patient is a 25 year old male who sustained an injury to his right ankle playing basketball about 2 weeks ago.  He pushed off and felt a pop at the posterior ankle.  Physical exam and an MRI reveal an Achilles tendon rupture.  He presents today for surgical repair of his Achilles tendon.  Past Medical History:  Diagnosis Date  . History of COVID-19 09/09/2020    Past Surgical History:  Procedure Laterality Date  . WISDOM TOOTH EXTRACTION      Family History  Problem Relation Age of Onset  . Hypertension Mother   . Hypertension Maternal Grandmother    Social History:  reports that he has never smoked. He has never used smokeless tobacco. He reports that he does not drink alcohol and does not use drugs.  Allergies: No Known Allergies  Medications Prior to Admission  Medication Sig Dispense Refill  . acetaminophen (TYLENOL) 500 MG tablet Take 500 mg by mouth every 6 (six) hours as needed.    . Cholecalciferol (VITAMIN D-3 PO) Take by mouth.    . vitamin E 100 UNIT capsule Take by mouth daily.      No results found for this or any previous visit (from the past 48 hour(s)). No results found.  Review of Systems no recent fever, chills, nausea, vomiting or changes in his appetite  Blood pressure 138/87, pulse 99, temperature (!) 97 F (36.1 C), temperature source Oral, resp. rate 10, height 6\' 2"  (1.88 m), weight 98.2 kg, SpO2 100 %. Physical Exam  Well-nourished well-developed male in no apparent distress.  Alert and oriented x4.  Normal mood and affect.  Gait is antalgic to the right.  The right Achilles has a palpable defect at the tendon.  Skin is healthy and intact.  4-5 strength in plantarflexion.  Positive Thompson test.  Pulses are palpable in the foot.   Assessment/Plan Right Achilles tendon rupture -to the operating room today for Achilles tendon repair.  The risks and benefits of the  alternative treatment options have been discussed in detail.  The patient wishes to proceed with surgery and specifically understands risks of bleeding, infection, nerve damage, blood clots, need for additional surgery, amputation and death.   , MD 2020-10-28, 7:24 AM

## 2020-10-05 NOTE — Anesthesia Procedure Notes (Signed)
Anesthesia Regional Block: Popliteal block   Pre-Anesthetic Checklist: ,, timeout performed, Correct Patient, Correct Site, Correct Laterality, Correct Procedure, Correct Position, site marked, Risks and benefits discussed,  Surgical consent,  Pre-op evaluation,  At surgeon's request and post-op pain management  Laterality: Right  Prep: chloraprep       Needles:  Injection technique: Single-shot  Needle Type: Stimiplex     Needle Length: 9cm  Needle Gauge: 21     Additional Needles:   Procedures:,,,, ultrasound used (permanent image in chart),,,,  Narrative:  Start time: 10/05/2020 7:09 AM End time: 10/05/2020 7:14 AM Injection made incrementally with aspirations every 5 mL.  Performed by: Personally  Anesthesiologist: Lowella Curb, MD

## 2020-10-09 ENCOUNTER — Encounter (HOSPITAL_BASED_OUTPATIENT_CLINIC_OR_DEPARTMENT_OTHER): Payer: Self-pay | Admitting: Orthopedic Surgery

## 2020-12-05 DIAGNOSIS — M25671 Stiffness of right ankle, not elsewhere classified: Secondary | ICD-10-CM | POA: Diagnosis not present

## 2020-12-05 DIAGNOSIS — M25571 Pain in right ankle and joints of right foot: Secondary | ICD-10-CM | POA: Diagnosis not present

## 2020-12-08 DIAGNOSIS — M25571 Pain in right ankle and joints of right foot: Secondary | ICD-10-CM | POA: Diagnosis not present

## 2020-12-11 DIAGNOSIS — M25571 Pain in right ankle and joints of right foot: Secondary | ICD-10-CM | POA: Diagnosis not present

## 2020-12-13 DIAGNOSIS — M25571 Pain in right ankle and joints of right foot: Secondary | ICD-10-CM | POA: Diagnosis not present

## 2020-12-18 DIAGNOSIS — M25571 Pain in right ankle and joints of right foot: Secondary | ICD-10-CM | POA: Diagnosis not present

## 2020-12-22 DIAGNOSIS — M25571 Pain in right ankle and joints of right foot: Secondary | ICD-10-CM | POA: Diagnosis not present

## 2020-12-25 DIAGNOSIS — M25571 Pain in right ankle and joints of right foot: Secondary | ICD-10-CM | POA: Diagnosis not present

## 2021-01-02 DIAGNOSIS — Z13220 Encounter for screening for lipoid disorders: Secondary | ICD-10-CM | POA: Diagnosis not present

## 2021-01-02 DIAGNOSIS — Z0000 Encounter for general adult medical examination without abnormal findings: Secondary | ICD-10-CM | POA: Diagnosis not present

## 2021-01-02 DIAGNOSIS — M25571 Pain in right ankle and joints of right foot: Secondary | ICD-10-CM | POA: Diagnosis not present

## 2021-01-02 DIAGNOSIS — R030 Elevated blood-pressure reading, without diagnosis of hypertension: Secondary | ICD-10-CM | POA: Diagnosis not present

## 2021-01-05 DIAGNOSIS — M25571 Pain in right ankle and joints of right foot: Secondary | ICD-10-CM | POA: Diagnosis not present

## 2021-01-05 DIAGNOSIS — M25671 Stiffness of right ankle, not elsewhere classified: Secondary | ICD-10-CM | POA: Diagnosis not present

## 2021-01-25 DIAGNOSIS — M25571 Pain in right ankle and joints of right foot: Secondary | ICD-10-CM | POA: Diagnosis not present

## 2021-01-25 DIAGNOSIS — M25671 Stiffness of right ankle, not elsewhere classified: Secondary | ICD-10-CM | POA: Diagnosis not present

## 2021-02-08 DIAGNOSIS — R030 Elevated blood-pressure reading, without diagnosis of hypertension: Secondary | ICD-10-CM | POA: Diagnosis not present

## 2021-04-20 DIAGNOSIS — J069 Acute upper respiratory infection, unspecified: Secondary | ICD-10-CM | POA: Diagnosis not present

## 2021-04-20 DIAGNOSIS — Z03818 Encounter for observation for suspected exposure to other biological agents ruled out: Secondary | ICD-10-CM | POA: Diagnosis not present

## 2022-03-27 DIAGNOSIS — R030 Elevated blood-pressure reading, without diagnosis of hypertension: Secondary | ICD-10-CM | POA: Diagnosis not present

## 2022-03-27 DIAGNOSIS — Z0000 Encounter for general adult medical examination without abnormal findings: Secondary | ICD-10-CM | POA: Diagnosis not present

## 2022-03-27 DIAGNOSIS — Z13220 Encounter for screening for lipoid disorders: Secondary | ICD-10-CM | POA: Diagnosis not present

## 2022-06-20 DIAGNOSIS — M79605 Pain in left leg: Secondary | ICD-10-CM | POA: Diagnosis not present

## 2022-07-03 DIAGNOSIS — M7661 Achilles tendinitis, right leg: Secondary | ICD-10-CM | POA: Diagnosis not present

## 2022-07-03 DIAGNOSIS — M7662 Achilles tendinitis, left leg: Secondary | ICD-10-CM | POA: Diagnosis not present

## 2022-07-23 DIAGNOSIS — M7661 Achilles tendinitis, right leg: Secondary | ICD-10-CM | POA: Diagnosis not present

## 2022-07-23 DIAGNOSIS — M7662 Achilles tendinitis, left leg: Secondary | ICD-10-CM | POA: Diagnosis not present

## 2022-07-30 DIAGNOSIS — M7662 Achilles tendinitis, left leg: Secondary | ICD-10-CM | POA: Diagnosis not present

## 2022-07-30 DIAGNOSIS — M7661 Achilles tendinitis, right leg: Secondary | ICD-10-CM | POA: Diagnosis not present

## 2022-08-01 DIAGNOSIS — M7662 Achilles tendinitis, left leg: Secondary | ICD-10-CM | POA: Diagnosis not present

## 2022-08-01 DIAGNOSIS — M7661 Achilles tendinitis, right leg: Secondary | ICD-10-CM | POA: Diagnosis not present

## 2022-08-06 DIAGNOSIS — M7662 Achilles tendinitis, left leg: Secondary | ICD-10-CM | POA: Diagnosis not present

## 2022-08-06 DIAGNOSIS — M7661 Achilles tendinitis, right leg: Secondary | ICD-10-CM | POA: Diagnosis not present

## 2022-08-13 DIAGNOSIS — M7661 Achilles tendinitis, right leg: Secondary | ICD-10-CM | POA: Diagnosis not present

## 2022-08-13 DIAGNOSIS — M7662 Achilles tendinitis, left leg: Secondary | ICD-10-CM | POA: Diagnosis not present

## 2022-08-15 DIAGNOSIS — M7661 Achilles tendinitis, right leg: Secondary | ICD-10-CM | POA: Diagnosis not present

## 2022-08-15 DIAGNOSIS — M7662 Achilles tendinitis, left leg: Secondary | ICD-10-CM | POA: Diagnosis not present

## 2022-08-20 DIAGNOSIS — M7661 Achilles tendinitis, right leg: Secondary | ICD-10-CM | POA: Diagnosis not present

## 2022-08-20 DIAGNOSIS — M7662 Achilles tendinitis, left leg: Secondary | ICD-10-CM | POA: Diagnosis not present

## 2022-08-22 DIAGNOSIS — M7662 Achilles tendinitis, left leg: Secondary | ICD-10-CM | POA: Diagnosis not present

## 2022-08-22 DIAGNOSIS — M7661 Achilles tendinitis, right leg: Secondary | ICD-10-CM | POA: Diagnosis not present

## 2023-01-31 DIAGNOSIS — Z5329 Procedure and treatment not carried out because of patient's decision for other reasons: Secondary | ICD-10-CM | POA: Diagnosis not present

## 2023-01-31 DIAGNOSIS — I10 Essential (primary) hypertension: Secondary | ICD-10-CM | POA: Diagnosis not present

## 2023-06-10 DIAGNOSIS — Z0000 Encounter for general adult medical examination without abnormal findings: Secondary | ICD-10-CM | POA: Diagnosis not present

## 2023-11-12 DIAGNOSIS — J069 Acute upper respiratory infection, unspecified: Secondary | ICD-10-CM | POA: Diagnosis not present

## 2023-11-12 DIAGNOSIS — Z79899 Other long term (current) drug therapy: Secondary | ICD-10-CM | POA: Diagnosis not present

## 2023-11-12 DIAGNOSIS — R030 Elevated blood-pressure reading, without diagnosis of hypertension: Secondary | ICD-10-CM | POA: Diagnosis not present

## 2024-02-03 DIAGNOSIS — S0501XA Injury of conjunctiva and corneal abrasion without foreign body, right eye, initial encounter: Secondary | ICD-10-CM | POA: Diagnosis not present

## 2024-02-03 DIAGNOSIS — H5789 Other specified disorders of eye and adnexa: Secondary | ICD-10-CM | POA: Diagnosis not present

## 2024-03-29 ENCOUNTER — Other Ambulatory Visit (HOSPITAL_COMMUNITY): Payer: Self-pay | Admitting: Internal Medicine

## 2024-03-29 DIAGNOSIS — I10 Essential (primary) hypertension: Secondary | ICD-10-CM | POA: Diagnosis not present

## 2024-04-29 DIAGNOSIS — I10 Essential (primary) hypertension: Secondary | ICD-10-CM | POA: Diagnosis not present

## 2024-05-05 ENCOUNTER — Ambulatory Visit (HOSPITAL_COMMUNITY)

## 2024-06-10 ENCOUNTER — Ambulatory Visit (HOSPITAL_COMMUNITY)
Admission: RE | Admit: 2024-06-10 | Discharge: 2024-06-10 | Disposition: A | Discharge status: Home / Self Care | Source: Ambulatory Visit | Attending: Internal Medicine | Admitting: Internal Medicine

## 2024-06-10 DIAGNOSIS — I10 Essential (primary) hypertension: Secondary | ICD-10-CM | POA: Insufficient documentation

## 2024-06-11 LAB — ECHOCARDIOGRAM COMPLETE
Area-P 1/2: 5.46 cm2
S' Lateral: 3 cm
